# Patient Record
Sex: Female | Born: 2000 | Hispanic: Yes | Marital: Single | Smoking: Never smoker
Health system: Southern US, Community
[De-identification: ages and names within clinical notes are randomized; demographics above are authoritative.]

## PROBLEM LIST (undated history)

## (undated) DIAGNOSIS — Z789 Other specified health status: Secondary | ICD-10-CM

## (undated) HISTORY — PX: NO PAST SURGERIES: SHX2092

---
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
## 2022-02-11 LAB — OB RESULTS CONSOLE HIV ANTIBODY (ROUTINE TESTING): HIV: NONREACTIVE

## 2022-02-11 LAB — OB RESULTS CONSOLE RUBELLA ANTIBODY, IGM: Rubella: IMMUNE

## 2022-05-29 LAB — OB RESULTS CONSOLE HEPATITIS B SURFACE ANTIGEN: Hepatitis B Surface Ag: NEGATIVE

## 2022-07-25 LAB — OB RESULTS CONSOLE GC/CHLAMYDIA
Chlamydia: NEGATIVE
Neisseria Gonorrhea: NEGATIVE

## 2022-07-25 LAB — OB RESULTS CONSOLE GBS: GBS: NEGATIVE

## 2022-08-03 ENCOUNTER — Encounter (HOSPITAL_COMMUNITY): Payer: Self-pay

## 2022-08-03 ENCOUNTER — Inpatient Hospital Stay (HOSPITAL_COMMUNITY)
Admission: AD | Admit: 2022-08-03 | Discharge: 2022-08-03 | Disposition: A | Discharge status: Home / Self Care | Payer: Self-pay | Attending: Obstetrics & Gynecology | Admitting: Obstetrics & Gynecology

## 2022-08-03 DIAGNOSIS — O471 False labor at or after 37 completed weeks of gestation: Secondary | ICD-10-CM | POA: Insufficient documentation

## 2022-08-03 DIAGNOSIS — Z3A37 37 weeks gestation of pregnancy: Secondary | ICD-10-CM | POA: Insufficient documentation

## 2022-08-03 DIAGNOSIS — O479 False labor, unspecified: Secondary | ICD-10-CM

## 2022-08-03 NOTE — MAU Note (Signed)
.  Whitney Stephens is a 21 y.o. at [redacted]w[redacted]d here in MAU reporting: ctx on and off all day. Denies any bleeding reports a thick white discharge. Has not felt baby move about 10 am  Onset of complaint: 10 Pain score: 6 Vitals:   08/03/22 1559  BP: 132/62  Pulse: 93  Resp: 18  Temp: 98 F (36.7 C)     FHT:143 Lab orders placed from triage:  labor eval

## 2022-08-03 NOTE — MAU Provider Note (Signed)
Event Date/Time  First Provider Initiated Contact with Patient 08/03/22 1618   S: Ms. Whitney Stephens is a 21 y.o. G2P0000 at [redacted]w[redacted]d  who presents to MAU today complaining contractions q 3-5 minutes since this morning. She denies vaginal bleeding. She denies LOF. She reports normal fetal movement.    O: BP 132/62   Pulse 93   Temp 98 F (36.7 C)   Resp 18   Wt 78.5 kg  GENERAL: Well-developed, well-nourished female in no acute distress.  HEAD: Normocephalic, atraumatic.  CHEST: Normal effort of breathing, regular heart rate ABDOMEN: Soft, nontender, gravid  Cervical exam:  Dilation: 1 Presentation: Vertex Exam by:: J. Rash,NP  Cervix rechecked after 2 hours and remained the same. She is with partner who will bring her back if contractions get stronger and closer together.   Fetal Monitoring: Baseline: 140 bpm Variability: Moderate Accelerations: 15x15 Decelerations: None Contractions: 6-7 mins apart.    A: SIUP at [redacted]w[redacted]d  False labor  P:  Strict labor precautions Fetal kick counts reviewed, she is feeling baby move.    Venia Carbon I, NP 08/03/2022 6:03 PM

## 2022-08-13 ENCOUNTER — Encounter (HOSPITAL_COMMUNITY): Payer: Self-pay | Admitting: Obstetrics and Gynecology

## 2022-08-13 ENCOUNTER — Inpatient Hospital Stay (HOSPITAL_COMMUNITY)
Admission: AD | Admit: 2022-08-13 | Discharge: 2022-08-13 | Disposition: A | Discharge status: Home / Self Care | Payer: Self-pay | Attending: Obstetrics and Gynecology | Admitting: Obstetrics and Gynecology

## 2022-08-13 DIAGNOSIS — Z3A38 38 weeks gestation of pregnancy: Secondary | ICD-10-CM

## 2022-08-13 DIAGNOSIS — Z3689 Encounter for other specified antenatal screening: Secondary | ICD-10-CM

## 2022-08-13 DIAGNOSIS — Z3493 Encounter for supervision of normal pregnancy, unspecified, third trimester: Secondary | ICD-10-CM | POA: Insufficient documentation

## 2022-08-13 DIAGNOSIS — O479 False labor, unspecified: Secondary | ICD-10-CM

## 2022-08-13 DIAGNOSIS — Z0371 Encounter for suspected problem with amniotic cavity and membrane ruled out: Secondary | ICD-10-CM

## 2022-08-13 LAB — POCT FERN TEST: POCT Fern Test: NEGATIVE

## 2022-08-13 NOTE — MAU Provider Note (Signed)
Event Date/Time   First Provider Initiated Contact with Patient 08/13/22 [phone]       S: Ms. Whitney Stephens is a 21 y.o. G2P0010 at [redacted]w[redacted]d  who presents to MAU today complaining of leaking of fluid since Monday. She denies vaginal bleeding at onset of leaking, but is experiencing it now. She endorses contractions. She reports normal fetal movement.    O: BP 116/65   Pulse 82   Temp 98.1 F (36.7 C) (Oral)   Resp 16   Wt 81.7 kg   SpO2 100%  GENERAL: Well-developed, well-nourished female in no acute distress.  HEAD: Normocephalic, atraumatic.  CHEST: Normal effort of breathing, regular heart rate ABDOMEN: Soft, nontender, gravid PELVIC: Normal external female genitalia. Vagina is pink and rugated. Cervix with normal contour, no lesions. Normal discharge.  Negative pooling. Fern Collected  Cervical exam:  Dilation: 1.5 Effacement (%): 70 Station: -2 Presentation: Vertex Exam by:: Georgina Snell, RN   Fetal Monitoring: FHT: 145 bpm, Mod Var, -Decels, +Accels Toco: Occasional ctx  Results for orders placed or performed during the hospital encounter of 08/13/22 (from the past 24 hour(s))  POCT fern test     Status: None   Collection Time: 08/13/22  7:27 AM  Result Value Ref Range   POCT Fern Test Negative = intact amniotic membranes      A: SIUP at [redacted]w[redacted]d  Membranes intact Cat I FT  P: Fern negative per nurse. Proceed with labor check and cervix remains unchanged. NST reactive Discharge to home with precautions. Follow up as scheduled.  Gerrit Heck, CNM 08/13/2022 8:55 AM

## 2022-08-13 NOTE — MAU Note (Signed)
.  Whitney Stephens is a 21 y.o. at [redacted]w[redacted]d here in MAU reporting: started having ctx every 5-7 minutes around midnight (7/10) and some bloody show.  Reports leaking of white, watery fluid that started Monday morning and has continued to leak since. Last intercourse yesterday at 2100. Reports good FM.  Last cervical exam was on 8/5 and was 1 cm.   Onset of complaint: midnight Pain score: 7/10 Vitals:   08/13/22 0708  BP: 123/73  Pulse: 85  Resp: 16  Temp: 98.1 F (36.7 C)  SpO2: 100%     FHT:137 Lab orders placed from triage:  mau labor

## 2022-08-13 NOTE — MAU Note (Signed)
I have communicated with Gerrit Heck, CNM and reviewed vital signs:  Vitals:   08/13/22 0722 08/13/22 0902  BP: 116/65 124/75  Pulse: 82 70  Resp:    Temp:    SpO2:      Vaginal exam:  Dilation: 1.5 Effacement (%): 70 Station: -2 Presentation: Vertex Exam by:: Georgina Snell, RN,   Also reviewed contraction pattern and that non-stress test is reactive.  It has been documented that patient is having irregular contractions with no cervical change over 1 hour not indicating active labor.  Patient denies any other complaints.  Based on this report provider has given order for discharge.  A discharge order and diagnosis entered by a provider.   Labor discharge instructions reviewed with patient. Patient verbalized understanding on when to return to the hospital.

## 2022-08-14 ENCOUNTER — Encounter (HOSPITAL_COMMUNITY): Payer: Self-pay | Admitting: Obstetrics and Gynecology

## 2022-08-14 ENCOUNTER — Inpatient Hospital Stay (HOSPITAL_COMMUNITY)
Admission: AD | Admit: 2022-08-14 | Discharge: 2022-08-16 | DRG: 807 | Disposition: A | Discharge status: Home / Self Care | Payer: Medicaid Other | Attending: Obstetrics and Gynecology | Admitting: Obstetrics and Gynecology

## 2022-08-14 ENCOUNTER — Inpatient Hospital Stay (HOSPITAL_COMMUNITY)
Admission: AD | Admit: 2022-08-14 | Discharge: 2022-08-14 | Discharge status: Against Medical Advice | Payer: Medicaid Other | Attending: Obstetrics and Gynecology | Admitting: Obstetrics and Gynecology

## 2022-08-14 ENCOUNTER — Other Ambulatory Visit: Payer: Self-pay

## 2022-08-14 ENCOUNTER — Encounter (HOSPITAL_COMMUNITY): Payer: Self-pay | Admitting: Obstetrics & Gynecology

## 2022-08-14 DIAGNOSIS — O4202 Full-term premature rupture of membranes, onset of labor within 24 hours of rupture: Secondary | ICD-10-CM | POA: Diagnosis not present

## 2022-08-14 DIAGNOSIS — Z3490 Encounter for supervision of normal pregnancy, unspecified, unspecified trimester: Principal | ICD-10-CM

## 2022-08-14 DIAGNOSIS — Z3A39 39 weeks gestation of pregnancy: Secondary | ICD-10-CM

## 2022-08-14 DIAGNOSIS — Z5321 Procedure and treatment not carried out due to patient leaving prior to being seen by health care provider: Secondary | ICD-10-CM | POA: Diagnosis not present

## 2022-08-14 DIAGNOSIS — O326XX0 Maternal care for compound presentation, not applicable or unspecified: Secondary | ICD-10-CM

## 2022-08-14 DIAGNOSIS — O26893 Other specified pregnancy related conditions, third trimester: Secondary | ICD-10-CM | POA: Diagnosis present

## 2022-08-14 LAB — CBC
HCT: 43.9 % (ref 36.0–46.0)
Hemoglobin: 14.5 g/dL (ref 12.0–15.0)
MCH: 28.9 pg (ref 26.0–34.0)
MCHC: 33 g/dL (ref 30.0–36.0)
MCV: 87.5 fL (ref 80.0–100.0)
Platelets: 307 10*3/uL (ref 150–400)
RBC: 5.02 MIL/uL (ref 3.87–5.11)
RDW: 15.4 % (ref 11.5–15.5)
WBC: 12.3 10*3/uL — ABNORMAL HIGH (ref 4.0–10.5)
nRBC: 0 % (ref 0.0–0.2)

## 2022-08-14 LAB — TYPE AND SCREEN
ABO/RH(D): O POS
Antibody Screen: NEGATIVE

## 2022-08-14 LAB — AMNISURE RUPTURE OF MEMBRANE (ROM) NOT AT ARMC: Amnisure ROM: POSITIVE

## 2022-08-14 MED ORDER — ACETAMINOPHEN 325 MG PO TABS: 650.0000 mg | ORAL_TABLET | ORAL | Status: DC | PRN

## 2022-08-14 MED ORDER — FENTANYL CITRATE (PF) 100 MCG/2ML IJ SOLN
50.0000 ug | Freq: Once | INTRAMUSCULAR | Status: AC
  Administered 2022-08-14: 50 ug via INTRAVENOUS
  Filled 2022-08-14: qty 2

## 2022-08-14 MED ORDER — OXYCODONE-ACETAMINOPHEN 5-325 MG PO TABS
2.0000 | ORAL_TABLET | ORAL | Status: DC | PRN
  Filled 2022-08-14: qty 2

## 2022-08-14 MED ORDER — FENTANYL CITRATE (PF) 100 MCG/2ML IJ SOLN
50.0000 ug | INTRAMUSCULAR | Status: DC | PRN
  Administered 2022-08-14 (×2): 100 ug via INTRAVENOUS
  Filled 2022-08-14 (×2): qty 2

## 2022-08-14 MED ORDER — LIDOCAINE HCL (PF) 1 % IJ SOLN
30.0000 mL | INTRAMUSCULAR | Status: AC | PRN
  Administered 2022-08-14: 30 mL via SUBCUTANEOUS
  Filled 2022-08-14: qty 30

## 2022-08-14 MED ORDER — LACTATED RINGERS IV SOLN: 500.0000 mL | INTRAVENOUS | Status: DC | PRN

## 2022-08-14 MED ORDER — FLEET ENEMA 7-19 GM/118ML RE ENEM: 1.0000 | ENEMA | RECTAL | Status: DC | PRN

## 2022-08-14 MED ORDER — SOD CITRATE-CITRIC ACID 500-334 MG/5ML PO SOLN: 30.0000 mL | ORAL | Status: DC | PRN

## 2022-08-14 MED ORDER — TRANEXAMIC ACID-NACL 1000-0.7 MG/100ML-% IV SOLN
1000.0000 mg | INTRAVENOUS | Status: AC
  Administered 2022-08-14: 1000 mg via INTRAVENOUS

## 2022-08-14 MED ORDER — LACTATED RINGERS IV SOLN: INTRAVENOUS | Status: DC

## 2022-08-14 MED ORDER — ONDANSETRON HCL 4 MG/2ML IJ SOLN: 4.0000 mg | Freq: Four times a day (QID) | INTRAMUSCULAR | Status: DC | PRN

## 2022-08-14 MED ORDER — FENTANYL CITRATE (PF) 100 MCG/2ML IJ SOLN: 50.0000 ug | INTRAMUSCULAR | Status: DC | PRN

## 2022-08-14 MED ORDER — TRANEXAMIC ACID-NACL 1000-0.7 MG/100ML-% IV SOLN
INTRAVENOUS | Status: AC
  Administered 2022-08-14: 1000 mg
  Filled 2022-08-14: qty 100

## 2022-08-14 MED ORDER — OXYTOCIN-SODIUM CHLORIDE 30-0.9 UT/500ML-% IV SOLN
2.5000 [IU]/h | INTRAVENOUS | Status: DC
  Filled 2022-08-14: qty 500

## 2022-08-14 MED ORDER — OXYCODONE-ACETAMINOPHEN 5-325 MG PO TABS
1.0000 | ORAL_TABLET | ORAL | Status: DC | PRN
  Administered 2022-08-14: 1 via ORAL

## 2022-08-14 MED ORDER — OXYTOCIN BOLUS FROM INFUSION
333.0000 mL | Freq: Once | INTRAVENOUS | Status: AC
  Administered 2022-08-14: 333 mL via INTRAVENOUS

## 2022-08-14 NOTE — MAU Provider Note (Signed)
S: Ms. Whitney Stephens is a 21 y.o. G2P0010 at [redacted]w[redacted]d  who presents to MAU today complaining of leaking of fluid since 1130. She denies vaginal bleeding. She endorses contractions. She reports normal fetal movement.    O: BP 128/74 (BP Location: Right Arm)   Temp 99.1 F (37.3 C) (Oral)   Resp 16   Ht 5\' 2"  (1.575 m)   Wt 81.9 kg   BMI 33.02 kg/m  GENERAL: Well-developed, well-nourished female in no acute distress.  HEAD: Normocephalic, atraumatic.  CHEST: Normal effort of breathing, regular heart rate ABDOMEN: Soft, nontender, gravid PELVIC: Normal external female genitalia. Vagina is pink and rugated. Cervix with normal contour, no lesions. Clear discharge saturating perineum.  Positive pooling with obvious BBOW during contractions. Fern Slide and Amnisure obtained.  Cervical exam:  Dilation: 3 Effacement (%): 90 Cervical Position: Posterior Station: -1 Presentation: Vertex Exam by:: Amylah Will, CNM   Fetal Monitoring: Baseline: 140 Variability: moderate Accelerations: 10 x 10 present Decelerations: variables Contractions: none  Results for orders placed or performed during the hospital encounter of 08/14/22 (from the past 24 hour(s))  Amnisure rupture of membrane (rom)not at [facility]     Status: None   Collection Time: 08/14/22  3:26 PM  Result Value Ref Range   Amnisure ROM POSITIVE     A: SIUP at [redacted]w[redacted]d  Possible Ruptured Forebag Indications for Care in Labor and Delivery  P: Admit to L&D for labor Routine Admission Orders [redacted]w[redacted]d, CNM notified of upcoming admission  Gerrit Heck, CNM 08/14/2022, 2:48 PM

## 2022-08-14 NOTE — Discharge Summary (Cosign Needed Addendum)
Postpartum Discharge Summary  Date of Service updated     Patient Name: Whitney Stephens DOB: 06/22/2001 MRN: [id number]  Date of admission: 08/14/2022 Delivery date:08/14/2022  Delivering provider: Gerlene Fee  Date of discharge: 08/16/2022  Admitting diagnosis: Intrauterine pregnancy [Z34.90] Intrauterine pregnancy: [redacted]w[redacted]d    Secondary diagnosis:  Principal Problem:   Intrauterine pregnancy  Additional problems:    Discharge diagnosis: Term Pregnancy Delivered                                              Post partum procedures: None Augmentation: N/A Complications: None  Hospital course: Onset of Labor With Vaginal Delivery      21y.o. yo G2P1011 at 332w0das admitted in Active Labor on 08/14/2022. Patient had an uncomplicated labor course as follows:  Membrane Rupture Time/Date: 10:30 AM ,08/14/2022   Delivery Method:Vaginal, Spontaneous  Episiotomy: None  Lacerations:  2nd degree  Patient had an uncomplicated postpartum course.  She is ambulating, tolerating a regular diet, passing flatus, and urinating well. Patient is discharged home in stable condition on 08/16/22.  Newborn Data: Birth date:08/14/2022  Birth time:9:04 PM  Gender:Female  Living status:Living  Apgars:9 ,9  Weight:2630 g   Magnesium Sulfate received: No BMZ received: No Rhophylac:No MMR:N/A T-DaP:Given prenatally Flu: N/A Transfusion:No  Physical exam  Vitals:   08/15/22 1510 08/15/22 1940 08/16/22 0548 08/16/22 1117  BP: (!) 93/55 (!) 91/53 (!) 101/51 (!) 101/57  Pulse: 79 70 79 77  Resp: '16 17 18   ' Temp: 98.2 F (36.8 C) 98.5 F (36.9 C) 97.8 F (36.6 C)   TempSrc: Oral Oral Oral   SpO2:   100% 100%  Weight:      Height:       General: alert, cooperative, and no distress Lochia: appropriate Uterine Fundus: firm Incision: N/A DVT Evaluation: NA Labs: Lab Results  Component Value Date   WBC 10.7 (H) 08/16/2022   HGB 10.6 (L) 08/16/2022   HCT 31.9 (L) 08/16/2022    MCV 90.4 08/16/2022   PLT 227 08/16/2022       No data to display         Edinburgh Score:    08/15/2022    6:10 PM  Edinburgh Postnatal Depression Scale Screening Tool  I have been able to laugh and see the funny side of things. 0  I have looked forward with enjoyment to things. 0  I have blamed myself unnecessarily when things went wrong. 0  I have been anxious or worried for no good reason. 0  I have felt scared or panicky for no good reason. 0  Things have been getting on top of me. 0  I have been so unhappy that I have had difficulty sleeping. 0  I have felt sad or miserable. 0  I have been so unhappy that I have been crying. 0  The thought of harming myself has occurred to me. 0  Edinburgh Postnatal Depression Scale Total 0     After visit meds:  Allergies as of 08/16/2022   No Known Allergies      Medication List     TAKE these medications    acetaminophen 325 MG tablet Commonly known as: Tylenol Take 2 tablets (650 mg total) by mouth every 4 (four) hours as needed (for pain scale < 4).   ibuprofen 600 MG  tablet Commonly known as: ADVIL Take 1 tablet (600 mg total) by mouth every 6 (six) hours.   prenatal vitamin w/FE, FA 27-1 MG Tabs tablet Take 1 tablet by mouth daily at 12 noon.         Discharge home in stable condition Infant Feeding: Breast Infant Disposition:home with mother Discharge instruction: per After Visit Summary and Postpartum booklet. Activity: Advance as tolerated. Pelvic rest for 6 weeks.  Diet: routine diet Future Appointments:No future appointments. Follow up Visit:  Message sent to HD 8/17 by Naaman Plummer Autry-Lott  Please schedule this patient for a In person postpartum visit in 4 weeks with the following provider: Any provider. Additional Postpartum F/U: none   Low risk pregnancy complicated by:  Language barrier; spanish speaking Delivery mode:  Vaginal, Spontaneous  Anticipated Birth Control:  Unsure    Patient  reported pre-syncopal episode this morning due to "seeing blood". RN reported that patient appeared faint when she encountered her in bed. EBL was less than 500 and she has no heavy bleeding or clots at this time. Repeat CBC is 10.6, slight drop from yesterday at 11.0.  She is safe for discharge with PO oral iron supplement RX.   08/16/2022 Starr Lake, CNM

## 2022-08-14 NOTE — MAU Note (Signed)
..  Whitney Stephens is a 21 y.o. at [redacted]w[redacted]d here in MAU reporting: leaking fluid since 1130 with contractions every 6-7 mins. No vaginal bleeding. +FM  Onset of complaint: 08/14/2022 Pain score: 10/10 Vitals:   08/14/22 1418  BP: 128/74  Resp: 16  Temp: 99.1 F (37.3 C)     FHT:151 Lab orders placed from triage:   UA

## 2022-08-14 NOTE — MAU Note (Signed)
.  Whitney Stephens is a 21 y.o. at [redacted]w[redacted]d here in MAU reporting contractions that are stronger and closer. Good FM. Some bloody show. Denies LOF. Was 1.5cm yesterday in MAU  Onset of complaint: tonight Pain score: 9 Vitals:   08/14/22 0443 08/14/22 0444  BP:  113/66  Pulse: 78   Resp: 16   Temp: 97.6 F (36.4 C)   SpO2: 100%      FHT:140 Lab orders placed from triage:  mau labor eval

## 2022-08-14 NOTE — H&P (Signed)
Whitney Stephens is a 21 y.o. female, G2P0010 at 39.0 weeks by 18.5 wk [place], presenting for SROM since 1130. Patient receives care at [facility] and was supervised for a low-risk pregnancy. Pregnancy and medical history unremarkable, but notable for previous miscarriage. She is GBS negative and expresses a desire for IV medication for pain management.  She is anticipating a female infant and requests no methods for PP birth control method.     There are no problems to display for this patient.   History of present pregnancy: Initiated PNC at 13.4 weeks Flu Declined, TDAP received  [place]:  03/25/22: 18.5 weeks AFI 10.3 cm, EFW 7.6%ile, Anterior Placenta, EDC change  04/22/22: 22.5 weeks, AFI 12.6cm, EFW 5.5%ile  OB History     Gravida  2   Para      Term      Preterm      AB  1   Living         SAB  1   IAB      Ectopic      Multiple      Live Births                Past Medical History:  Diagnosis Date   Medical history non-contributory    Past Surgical History:  Procedure Laterality Date   NO PAST SURGERIES     Family History: family history is not on file. Social History:  reports that she has never smoked. She does not have any smokeless tobacco history on file. She reports that she does not drink alcohol and does not use drugs.   Prenatal Transfer Tool  Maternal Diabetes: No Genetic Screening: Normal Maternal Ultrasounds/Referrals: Normal Fetal Ultrasounds or other Referrals:  None Maternal Substance Abuse:  No Significant Maternal Medications:  None Significant Maternal Lab Results: Group B Strep negative-OB Alert 7/31  Labs: O Positive Rubella: Immune Hep C: NR HIV: NR HbSAg: NR RPR: Negative Varicella: Immunce GC/CT: Negative UDS: Negative Hgb 5/31: 12.1  Maternal Assessment:  ROS: +Contractions, +LOF, -Vaginal Bleeding, +Fetal Movement  All other systems reviewed and negative.    No Known Allergies   Dilation: 3 Effacement  (%): 90 Station: -1 Exam by:: Rolitta, CNM Blood pressure 128/74, temperature 99.1 F (37.3 C), temperature source Oral, resp. rate 16, height 5\' 2"  (1.575 m), weight 81.9 kg, unknown if currently breastfeeding.  Physical Exam Vitals reviewed.  Constitutional:      Appearance: Normal appearance.  HENT:     Head: Normocephalic and atraumatic.  Eyes:     Conjunctiva/sclera: Conjunctivae normal.  Cardiovascular:     Rate and Rhythm: Normal rate.  Pulmonary:     Effort: Pulmonary effort is normal. No respiratory distress.  Abdominal:     General: Bowel sounds are normal.     Tenderness: There is no abdominal tenderness.     Comments: Gravid, Appears AGA, Soft RT, Ctx palpate moderate  Musculoskeletal:        General: Normal range of motion.     Cervical back: Normal range of motion.  Skin:    General: Skin is warm and dry.  Neurological:     Mental Status: She is alert and oriented to person, place, and time.  Psychiatric:        Mood and Affect: Mood normal.        Behavior: Behavior normal.     Fetal Assessment: Leopolds: -Pelvis: Adequate -EFW: 7lbs -Presentation: Vertex by Nurse Exam  FHR: 155 bpm, Mod Var, -  Decels, +Accels UCs:  Q3-56min    Assessment IUP at 39 weeks Cat I FT SROM at 1130 GBS Negative Language Barrier  Plan: Admit to [facility]  Routine Labor and Delivery Orders per Protocol In room to complete assessment and discuss POC: Patient reports increased discomfort and requests medication. Will give IV fentanyl Expectant Mgmt Anticipate SVD Interpretations completed with assistance of [employer] video; Lanora Manis [phone].   Joellyn Quails, MSN 08/14/2022, 4:05 PM

## 2022-08-14 NOTE — MAU Note (Signed)
Pt and significant other came to nurses desk requesting to leave. RN pulled pt and significant other back into room and got spanish interpreter on video remote call. RN explained to the pt that the plan was to recheck her cervix 1 hour after initial check, while monitoring her baby. Pt requested RN to go ahead with rechecking her cervix. RN completed cervical exam and pt was unchanged. RN asked pt if it would be ok to place EFM back on.. pt agreed. RN explained that I would talk to MD about further poc. Pt stated "can you talked to the doctor as soon as possible because I am ready to go."  0605 S. Autry-Lott, MD notified of encounter.. MD stated to ask pt if she is ok with being on EFM for 20-30 minutes to ensure fetal well-being.   [id number] RN returned to pt's room and asked if she would be ok with staying on EFM for 20-30 minutes to make sure baby is ok.. pt stating she does not want to stay and wants to go home. RN explained to pt that she is able to leave AMA if she desires. Pt requesting to leave AMA.   AMA form signed by pt and RN at [phone].   Dr. Salvadore Dom made aware.

## 2022-08-14 NOTE — Progress Notes (Signed)
Called by nursing staff in MAU that the patient was requesting to leave. Suggested further EFM 2/2 intermittent variables after reviewing the strip. Patient left AMA.   Lavonda Jumbo, DO OB Fellow, Faculty [facility], [facility] 08/14/2022, 8:38 PM

## 2022-08-15 ENCOUNTER — Encounter (HOSPITAL_COMMUNITY): Payer: Self-pay | Admitting: Obstetrics and Gynecology

## 2022-08-15 LAB — CBC
HCT: 33.5 % — ABNORMAL LOW (ref 36.0–46.0)
Hemoglobin: 11.1 g/dL — ABNORMAL LOW (ref 12.0–15.0)
MCH: 29.1 pg (ref 26.0–34.0)
MCHC: 33.1 g/dL (ref 30.0–36.0)
MCV: 87.9 fL (ref 80.0–100.0)
Platelets: 242 10*3/uL (ref 150–400)
RBC: 3.81 MIL/uL — ABNORMAL LOW (ref 3.87–5.11)
RDW: 15.3 % (ref 11.5–15.5)
WBC: 16.7 10*3/uL — ABNORMAL HIGH (ref 4.0–10.5)
nRBC: 0 % (ref 0.0–0.2)

## 2022-08-15 LAB — RPR: RPR Ser Ql: NONREACTIVE

## 2022-08-15 MED ORDER — WITCH HAZEL-GLYCERIN EX PADS: 1.0000 | MEDICATED_PAD | CUTANEOUS | Status: DC | PRN

## 2022-08-15 MED ORDER — COCONUT OIL OIL: 1.0000 | TOPICAL_OIL | Status: DC | PRN

## 2022-08-15 MED ORDER — SENNOSIDES-DOCUSATE SODIUM 8.6-50 MG PO TABS
2.0000 | ORAL_TABLET | Freq: Every day | ORAL | Status: DC
  Administered 2022-08-15 – 2022-08-16 (×2): 2 via ORAL
  Filled 2022-08-15 (×2): qty 2

## 2022-08-15 MED ORDER — SIMETHICONE 80 MG PO CHEW: 80.0000 mg | CHEWABLE_TABLET | ORAL | Status: DC | PRN

## 2022-08-15 MED ORDER — BENZOCAINE-MENTHOL 20-0.5 % EX AERO
1.0000 | INHALATION_SPRAY | CUTANEOUS | Status: DC | PRN
  Administered 2022-08-16: 1 via TOPICAL
  Filled 2022-08-15 (×2): qty 56

## 2022-08-15 MED ORDER — ACETAMINOPHEN 325 MG PO TABS
650.0000 mg | ORAL_TABLET | ORAL | Status: DC | PRN
  Administered 2022-08-15 – 2022-08-16 (×2): 650 mg via ORAL
  Filled 2022-08-15 (×2): qty 2

## 2022-08-15 MED ORDER — DIPHENHYDRAMINE HCL 25 MG PO CAPS: 25.0000 mg | ORAL_CAPSULE | Freq: Four times a day (QID) | ORAL | Status: DC | PRN

## 2022-08-15 MED ORDER — ONDANSETRON HCL 4 MG PO TABS: 4.0000 mg | ORAL_TABLET | ORAL | Status: DC | PRN

## 2022-08-15 MED ORDER — TETANUS-DIPHTH-ACELL PERTUSSIS 5-2.5-18.5 LF-MCG/0.5 IM SUSY: 0.5000 mL | PREFILLED_SYRINGE | Freq: Once | INTRAMUSCULAR | Status: DC

## 2022-08-15 MED ORDER — PRENATAL MULTIVITAMIN CH
1.0000 | ORAL_TABLET | Freq: Every day | ORAL | Status: DC
  Administered 2022-08-15 – 2022-08-16 (×2): 1 via ORAL
  Filled 2022-08-15 (×2): qty 1

## 2022-08-15 MED ORDER — DIBUCAINE (PERIANAL) 1 % EX OINT: 1.0000 | TOPICAL_OINTMENT | CUTANEOUS | Status: DC | PRN

## 2022-08-15 MED ORDER — IBUPROFEN 600 MG PO TABS
600.0000 mg | ORAL_TABLET | Freq: Four times a day (QID) | ORAL | Status: DC
  Administered 2022-08-15 – 2022-08-16 (×6): 600 mg via ORAL
  Filled 2022-08-15 (×6): qty 1

## 2022-08-15 MED ORDER — ONDANSETRON HCL 4 MG/2ML IJ SOLN: 4.0000 mg | INTRAMUSCULAR | Status: DC | PRN

## 2022-08-15 NOTE — Progress Notes (Addendum)
POSTPARTUM PROGRESS NOTE  Post Partum Day 1  Subjective:  Whitney Stephens is a 21 y.o. G2P1011 s/p SVD at [redacted]w[redacted]d. She did receive TXA immediately after delivery for blood loss. EBL 483 mL. No acute events overnight.  Pt denies problems with ambulating, voiding or po intake.  She denies nausea or vomiting.  Pain is well controlled.  She has not had flatus. She has not had bowel movement.  Lochia Minimal.   Objective: Blood pressure (!) 108/53, pulse 88, temperature 98 F (36.7 C), temperature source Oral, resp. rate 18, height 5\' 2"  (1.575 m), weight 81.9 kg, SpO2 100 %, unknown if currently breastfeeding.  Physical Exam:  General: alert, cooperative and no distress Chest: no respiratory distress Heart:regular rate, distal pulses intact Abdomen: soft, nontender,  Uterine Fundus: firm, appropriately tender DVT Evaluation: No calf swelling or tenderness Extremities: no edema Skin: warm, dry;  Recent Labs    08/14/22 1620 08/15/22 0410  HGB 14.5 11.1*  HCT 43.9 33.5*    Assessment/Plan: Whitney Stephens 08/17[birth year]22 is a 21 y.o. G2P1011 s/p SVD at [redacted]w[redacted]d   PPD#1 - Doing well Contraception: Undecided Feeding: Breast and bottle Dispo: Plan for discharge tomorrow.   LOS: 1 day   [redacted]w[redacted]d, Medical Student 08/15/2022, 7:49 AM    I was personally present and re-performed the exam and MDM and verified the service and findings are accurately documented in the student's note. Karem Tomaso Autry-Lott, DO 08/15/22 8:26 AM

## 2022-08-15 NOTE — Progress Notes (Addendum)
Dr Nobie Putnam is called to report the pt was dizzy and fainted in the BR Pt was seated safely on the toilet and transported to bed via stedy. Ammonia salts were used to arouse pt. BP 89/73, HR 86. In 5 minutes BP 103/55 HR 76.  FF1 below Umbilicus after voiding. Small lochia is noted. No clots. Pt is alert now and plans to eat breakfast and hydrate. No new orders are received.

## 2022-08-15 NOTE — Progress Notes (Signed)
At 0430 patient called out saying she was in pain (cramping) and needed to go to the bathroom. She also stated she felt a lot of gushing. I checked her vitals at that time, 105/66 and pulse of 70. She did have a lot of blood in her pad, but that was the pad she had on since she came up from L&D. Her bleeding was minimal with her fundal rub and she was firm at U/E. She stated she felt fine otherwise. With my assistance she ambulated to the bathroom well. She sat on the toilet and shortly after started shaking, became pale and clammy and stated she was not feeling well. She started to faint. I held on to her and called for assistance. Two other nurses came in with ammonia and a stedy and we got her more alert and onto the stedy. We got her in bed, her BP was 138/61 and pulse was 124 her bleeding was scant and still U/E. I bladder scanned her and she had >899 in her bladder. At this time the MD walked in to round on the patient. Pt refused an in and out cath and to void in a bed pan. Pt started to look better and with the help of the MD and nurses we got the patient back on the stedy and pt was able to void. We got the patient back in bed and checked her vitals and fundal one more time 108/53 and 88 for her pulse. I educated patient on calling out when she feels like she had to void again so she can get assistance. Will continue to monitor.

## 2022-08-15 NOTE — Lactation Note (Signed)
This note was copied from a baby's chart. Lactation Consultation Note  Patient Name: Whitney Stephens [id number] Date: 08/15/2022 Reason for consult: Initial assessment;Primapara;1st time breastfeeding;Term;Infant < 6lbs Age:19 hours  LC in to visit with 21 yr old P17 birth parent of term baby born weighing 5 lbs 12.8 oz.  Baby has had attempts at the breast, one spoon feeding of 10 ml EBM and 2 formula feedings by bottle.  As LC entered, birth parent trying to latch baby to the breast.  LC offered assistance and provided pillow support.  Baby sleepy with occasional attempts, but unable to sustain a latch.    RN entered and assessed parent.  Parent up to bathroom and became faint.  Baby swaddled in crib.  Provided FOB with formula by bottle as it had been 5 hrs since baby had had a feeding.  LC set up DEBP at bedside.  Currently birth parent is resting and recovering from episode.  BP low.    LC will return for teaching and assisting with double pumping to support milk supply.  Maternal Data Has patient been taught Hand Expression?: Yes Does the patient have breastfeeding experience prior to this delivery?: No  Feeding Mother's Current Feeding Choice: Breast Milk and Formula Nipple Type: Slow - flow  LATCH Score Latch: Too sleepy or reluctant, no latch achieved, no sucking elicited.  Audible Swallowing: None  Type of Nipple: Everted at rest and after stimulation  Comfort (Breast/Nipple): Soft / non-tender  Hold (Positioning): Assistance needed to correctly position infant at breast and maintain latch.  LATCH Score: 5   Lactation Tools Discussed/Used Tools: Pump;Flanges;Bottle Breast pump type: Double-Electric Breast Pump;Manual Pump Education: Setup, frequency, and cleaning;Milk Storage Reason for Pumping: support milk supply/<6 lbs Pumping frequency: Mom encouraged to double pump after breastfeeding or attempts  Interventions Interventions: Breast feeding basics  reviewed;Assisted with latch;Skin to skin;Breast massage;Hand express;Support pillows;Position options;Expressed milk;Hand pump;DEBP;LC Services brochure   Consult Status Consult Status: Follow-up Date: 08/16/22 Follow-up type: In-patient    Whitney Stephens 08/15/2022, 9:22 AM

## 2022-08-16 LAB — CBC
HCT: 31.9 % — ABNORMAL LOW (ref 36.0–46.0)
Hemoglobin: 10.6 g/dL — ABNORMAL LOW (ref 12.0–15.0)
MCH: 30 pg (ref 26.0–34.0)
MCHC: 33.2 g/dL (ref 30.0–36.0)
MCV: 90.4 fL (ref 80.0–100.0)
Platelets: 227 10*3/uL (ref 150–400)
RBC: 3.53 MIL/uL — ABNORMAL LOW (ref 3.87–5.11)
RDW: 15.9 % — ABNORMAL HIGH (ref 11.5–15.5)
WBC: 10.7 10*3/uL — ABNORMAL HIGH (ref 4.0–10.5)
nRBC: 0 % (ref 0.0–0.2)

## 2022-08-16 MED ORDER — FERROUS SULFATE 325 (65 FE) MG PO TABS: 325.0000 mg | ORAL_TABLET | ORAL | 0 refills | Status: AC

## 2022-08-16 MED ORDER — IBUPROFEN 600 MG PO TABS: 600.0000 mg | ORAL_TABLET | Freq: Four times a day (QID) | ORAL | 0 refills | Status: AC

## 2022-08-16 MED ORDER — ACETAMINOPHEN 325 MG PO TABS: 650.0000 mg | ORAL_TABLET | ORAL | Status: AC | PRN

## 2022-08-16 NOTE — Lactation Note (Signed)
This note was copied from a baby's chart. Lactation Consultation Note  Patient Name: Whitney Stephens Date: 08/16/2022 Reason for consult: Follow-up assessment;1st time breastfeeding;Primapara;Term;Infant < 6lbs Age:19 hours  LC in to visit with birth parent on day of discharge.  Baby is at 1% weight loss with good output.  Baby has been fed formula by bottle with a few attempts at the breast.  Pump was set up yesterday and RN assisted parent to pump when she was feeling better.  Birth parent reports she pumped a few times and expressed a little colostrum.    Baby starting to cue.  LC offered to assist with latching baby to the breast, but Mom preferred to give baby a bottle.  Encouraged Mom to call for latch assist prior to discharge.  Demonstrated how the hand pump works as Mom does not have a DEBP at home.     Lactation Tools Discussed/Used Tools: Pump;Flanges Breast pump type: Double-Electric Breast Pump Pump Education: Setup, frequency, and cleaning;Milk Storage Reason for Pumping: Support milk supply/<6 lb baby Pumping frequency: Mom encouraged to pump after each feeding Pumped volume: 0 mL  Discharge Pump: Manual WIC Program: No  Consult Status Consult Status: Complete Date: 08/16/22 Follow-up type: Call as needed    Judee Clara 08/16/2022, 8:28 AM

## 2022-08-24 ENCOUNTER — Telehealth (HOSPITAL_COMMUNITY): Payer: Self-pay | Admitting: *Deleted

## 2022-08-24 NOTE — Telephone Encounter (Signed)
Voicemail not setup. Unable to leave message.  Duffy Rhody, RN 08-24-2022 at 2:19pm
